# Patient Record
Sex: Female | Born: 1965 | Race: Black or African American | Hispanic: No | Marital: Married | Smoking: Never smoker
Health system: Southern US, Community
[De-identification: ages and names within clinical notes are randomized; demographics above are authoritative.]

## PROBLEM LIST (undated history)

## (undated) DIAGNOSIS — F329 Major depressive disorder, single episode, unspecified: Secondary | ICD-10-CM

## (undated) DIAGNOSIS — F4310 Post-traumatic stress disorder, unspecified: Secondary | ICD-10-CM

## (undated) DIAGNOSIS — I10 Essential (primary) hypertension: Secondary | ICD-10-CM

## (undated) DIAGNOSIS — F32A Depression, unspecified: Secondary | ICD-10-CM

## (undated) DIAGNOSIS — F319 Bipolar disorder, unspecified: Secondary | ICD-10-CM

## (undated) DIAGNOSIS — C801 Malignant (primary) neoplasm, unspecified: Secondary | ICD-10-CM

---

## 1997-12-02 ENCOUNTER — Inpatient Hospital Stay (HOSPITAL_COMMUNITY): Admission: AD | Admit: 1997-12-02 | Discharge: 1997-12-02 | Payer: Self-pay | Admitting: Obstetrics and Gynecology

## 1998-02-20 ENCOUNTER — Inpatient Hospital Stay (HOSPITAL_COMMUNITY): Admission: AD | Admit: 1998-02-20 | Discharge: 1998-02-20 | Payer: Self-pay | Admitting: Obstetrics and Gynecology

## 1998-02-21 ENCOUNTER — Inpatient Hospital Stay (HOSPITAL_COMMUNITY): Admission: AD | Admit: 1998-02-21 | Discharge: 1998-02-23 | Payer: Self-pay | Admitting: Obstetrics and Gynecology

## 1999-05-27 ENCOUNTER — Inpatient Hospital Stay (HOSPITAL_COMMUNITY): Admission: AD | Admit: 1999-05-27 | Discharge: 1999-05-29 | Payer: Self-pay | Admitting: *Deleted

## 2000-05-22 ENCOUNTER — Observation Stay (HOSPITAL_COMMUNITY): Admission: AD | Admit: 2000-05-22 | Discharge: 2000-05-22 | Payer: Self-pay | Admitting: Obstetrics and Gynecology

## 2000-05-22 ENCOUNTER — Encounter: Payer: Self-pay | Admitting: Obstetrics and Gynecology

## 2000-05-22 ENCOUNTER — Ambulatory Visit (HOSPITAL_COMMUNITY): Admission: RE | Admit: 2000-05-22 | Discharge: 2000-05-22 | Payer: Self-pay | Admitting: Obstetrics and Gynecology

## 2000-09-03 ENCOUNTER — Inpatient Hospital Stay (HOSPITAL_COMMUNITY): Admission: AD | Admit: 2000-09-03 | Discharge: 2000-09-03 | Payer: Self-pay | Admitting: Obstetrics and Gynecology

## 2000-09-09 ENCOUNTER — Encounter (INDEPENDENT_AMBULATORY_CARE_PROVIDER_SITE_OTHER): Payer: Self-pay | Admitting: Specialist

## 2000-09-09 ENCOUNTER — Inpatient Hospital Stay (HOSPITAL_COMMUNITY): Admission: AD | Admit: 2000-09-09 | Discharge: 2000-09-11 | Payer: Self-pay | Admitting: Obstetrics and Gynecology

## 2001-08-03 ENCOUNTER — Encounter: Admission: RE | Admit: 2001-08-03 | Discharge: 2001-08-03 | Payer: Self-pay | Admitting: Family Medicine

## 2001-08-03 ENCOUNTER — Encounter: Payer: Self-pay | Admitting: Family Medicine

## 2002-01-25 ENCOUNTER — Emergency Department (HOSPITAL_COMMUNITY): Admission: EM | Admit: 2002-01-25 | Discharge: 2002-01-25 | Payer: Self-pay | Admitting: Emergency Medicine

## 2002-01-25 ENCOUNTER — Encounter: Payer: Self-pay | Admitting: Emergency Medicine

## 2003-06-12 ENCOUNTER — Other Ambulatory Visit: Admission: RE | Admit: 2003-06-12 | Discharge: 2003-06-12 | Payer: Self-pay | Admitting: Obstetrics and Gynecology

## 2007-08-16 ENCOUNTER — Other Ambulatory Visit: Admission: RE | Admit: 2007-08-16 | Discharge: 2007-08-16 | Payer: Self-pay | Admitting: Family Medicine

## 2008-11-05 ENCOUNTER — Other Ambulatory Visit: Admission: RE | Admit: 2008-11-05 | Discharge: 2008-11-05 | Payer: Self-pay | Admitting: Family Medicine

## 2010-06-11 NOTE — Discharge Summary (Signed)
[facility]  Patient:    Bethany Flynn, Bethany Flynn Visit Number: [id number] MRN: [id number]          Service Type: OBS Location: [id number] Attending Physician:  Shaune Spittle Dictated by:   Vance Gather Duplantis, C.N.M. Adm. Date:  [id number] Disc. Date: 09/11/00                             Discharge Summary  ADMISSION DIAGNOSES:          1. Intrauterine pregnancy at term.                               2. Oligohydramnios.                               3. Active labor.                               4. Multiparity.                               5. Desires bilateral tubal ligation for                                  sterilization.  DISCHARGE DIAGNOSES:          1. Intrauterine pregnancy at term.                               2. Oligohydramnios.                               3. Active labor.                               4. Multiparity.                               5. Desires bilateral tubal ligation for                                  sterilization.                               6. Breast-feeding.  PROCEDURES THIS ADMISSION:    1. Normal spontaneous vaginal delivery of                                  a viable female infant named Duwayne Heck who weighed                                  6 pounds 6 ounces and had Apgars of 9 and 9  on September 09, 2000, attended by                                  Dr. Dierdre Forth.                               2. Postpartum bilateral tubal ligation for                                  sterilization on September 10, 2000, also by                                  Dr. Dierdre Forth.  HOSPITAL COURSE:              Bethany Flynn is a 45 year old, married black female, gravida 3, para 2-0-0-2, at 39-4/7 weeks who presents complaining of regular uterine contractions and in active labor on September 09, 2000. She progressed from 4 cm to delivery within about 45 minutes and delivered a viable female  infant named Duwayne Heck who weighed 6 pounds 6 ounces and had Apgars of 9 and 9 on September 09, 2000, attended by Dr. Dierdre Forth. Please see delivery note for details.  She desired bilateral tubal ligation for sterilization and underwent the same on September 10, 2000, also attended by Dr. Dierdre Forth without complication. Please see operative note for details.  Postpartally and postoperatively, she has done well. She is ambulating, voiding, and eating without difficulty. She is breast-feeding, also without difficulty. Her vital signs are stable and she is afebrile. She is deemed ready to discharge today.  DISCHARGE INSTRUCTIONS:       Instructions are as per the [facility] handout.  DISCHARGE MEDICATIONS:        1. Motrin 600 mg p.o. q.6h. p.r.n. for pain.                               2. Tylox one to two p.o. q.4-6h. p.r.n. for                                  pain.                               3. Prenatal vitamins daily.  DISCHARGE LABORATORIES:       Hemoglobin is 11.6, WBC count is 12.0, and platelets are 279,000.  DISCHARGE FOLLOWUP:           The patient is to follow up in six weeks at [facility] or p.r.n.  DD:  09/11/00 TD:  09/11/00 Job: [id number]

## 2010-06-11 NOTE — H&P (Signed)
[facility]  Patient:    Bethany Flynn, Bethany Flynn                       MRN: [id number] Adm. Date:  [id number] Attending:  Pleas Koch Dictator:   Mack Guise, C.N.M.                         History and Physical  HISTORY OF PRESENT ILLNESS:   Ms. Starkes is a 45 year old, gravida 2, para 1-0-0-1, at 39 weeks.  EDD Jun 02, 1999, by dates confirmed with pregnancy ultrasonography, who presents in early labor with contractions increasing in frequency and intensity. She reports positive fetal movement, no bleeding, no rupture of membranes.  Denies any headache, visual changes, or epigastric pain.  Her pregnancy has been followed by the M.D. service at [facility] and Gynecology and is remarkable for:  1) History of positive Group B Strep.  2) Second pregnancy in less than 12 months.  3) History of cryosurgery.  HISTORY OF PRESENT PREGNANCY: This patient was initially evaluated at the office of [facility] and Gynecology on October 06, 1998, at approximately [redacted] weeks gestation.  Her pregnancy has been essentially unremarkable and has been followed by the M.D. service.  Her uterus has been compatible in size with her dates.  Systolic blood pressure has ranged from 80 to 110, diastolic blood pressure has ranged from 60 to 80.  There has been no proteinuria.  PAST OBSTETRIC HISTORY:       In January of 2000, the patient had a normal spontaneous vaginal delivery with a birth of a 6 pound 4 ounce female infant with no complications.  PAST MEDICAL HISTORY:         Abnormal Pap smear, colpo, and cryosurgery. History of positive Group B Strep with last pregnancy.  FAMILY HISTORY:               Mother with congestive heart failure.  Maternal grandfather enlarged heart, chronic hypertension in the patients mother and father. History of maternal grandmother with lung cancer.  HABITS:                       The patient  denies the use of tobacco, alcohol, or illicit drugs.  MEDICATIONS:                  Prenatal vitamins.  ALLERGIES:                    No known drug allergies.  SOCIAL HISTORY:               Ms. Tuft is a 45 year old African-American married female.  Her husband is Cherre Kothari.  He is involved and supportive.  They are of the [place] faith.  PHYSICAL EXAMINATION:  VITAL SIGNS:                  Stable, afebrile.  HEENT:                        Unremarkable.  HEART:                        Regular rate and rhythm.  LUNGS:                        Clear.  ABDOMEN:  Gravid in contour.  Uterus fundus is noted to extend 39 cm above the level of the pubic symphysis.  Leopold maneuver finds fetus to e in the longitudinal lie cephalic presentation and the estimated fetal weight is 7 pounds.  PELVIC:                       Digital examination of the cervix finds it to be cm dilated, 90% effaced, with cephalic presenting part at -1 station and membranes  intact.  The baseline of the fetal heart rate monitor is 140s with average longterm variability, positive accellerations, no decellerations, and negative CST.  The  patient is contracting mildly and irregularly at the present time, but has made  change since her last cervical check in the office.  ASSESSMENT:                   1. Intrauterine pregnancy at 39 weeks.                               2. In early labor.  PLAN:                         Admit to birthing suite per Cecilio Asper, M.D.  Routine M.D. orders.  Group B Strep prophylaxis. DD:  05/27/99 TD:  05/27/99 Job: [id number]

## 2010-06-11 NOTE — H&P (Signed)
[facility]  Patient:    Bethany Flynn, Bethany Flynn                       MRN: [id number] Adm. Date:  [id number] Attending:  Leonard Schwartz Dictator:   Vance Gather Duplantis, C.N.M.                         History and Physical  CHIEF COMPLAINT:              Ms. Demarcus is a 45 year old married black female, gravida 3, para 2-0-0-2 t [redacted] weeks gestation, who presents complaining of significant shortness of breath that increased over the weekend.  She reports that it started on Saturday and had gradually increased in severity until this morning; was quite intolerable.  She reports that she had difficulty talking and singing to her children because of shortness of breath. She denied any significant chest pain, any coughing or cold symptoms; no fever or no audible wheezes to herself.  She had no trauma to her chest and denies any history of significant allergies or asthma.  OB-GYN HISTORY:               Her pregnancy has been followed at [facility] by the M.D. service, and has been at risk for: 1. Positive Group B strep with a previous pregnancy. 2. History of cryosurgery. 3. Third pregnancy in two years.  She delivered a viable female infant in January 2000, who weighed 6 pounds 4 ounces with no complications.  In May 2001 she delivered another female infant with no complications.  GENERAL MEDICAL HISTORY: 1. Usual childhood diseases. 2. No other significant medical problems, other than some anemia.  ALLERGIES:                    No known drug allergies.  FAMILY HISTORY:               Significant for mother with congestive heart failure, and maternal grandmother with enlarged heart.  Mother and father with chronic hypertension.  Maternal grandmother with lung cancer.  GENETIC HISTORY:              Negative.  SOCIAL HISTORY:               She is married to Tilman Neat, who is involved and supportive.  She is a full-time mom.  She denies  being of the [place] faith.  She denies any illicit drug use, alcohol or smoking with this pregnancy.  PRENATAL LABS:                Blood Type 0 positive.  Antibody screen negative.  Rubella equivocal.  Hepatitis B surface antigen negative.  HIV nonreactive.  GC and chlamydia both negative.  Pap within normal limits.  PHYSICAL EXAMINATION:  VITAL SIGNS:                  Temperature 98.8, pulse 91, respirations 22, blood pressure 128/61, O2 saturations are anywhere between 96 and 99% on room air.  Fetal heart rate 140 to 145, with variability.  No uterine contractions are noted.  CERVIX:                       Deferred at this time.  DIAGNOSTIC TESTING:           The patient had an equivocal VQ scan at [facility].  Her CBC was within normal limits.  ASSESSMENT:                   1. Intrauterine pregnancy at 24 weeks.                               2. Rule out pulmonary embolus.  PLAN:                         The patient, per Dr. Leonard Schwartz, is to be admitted for observation overnight.  Further discussion of plans will be made per himself. DD:  05/22/00 TD:  05/22/00 Job: [id number]

## 2010-06-11 NOTE — Op Note (Signed)
Bethany Flynn    [facility] [place]                                  MRN:  [id number]           Operative Report                                  ATT:  Erie Noe P. Pennie Rushing, M.D.  Procedure:  09/10/00             DICT: PREOPERATIVE DIAGNOSIS:       Desire for surgical sterilization.  POSTOPERATIVE DIAGNOSIS:      Desire for surgical sterilization.  OPERATION:                    Postpartum tubal sterilization.  SURGEON:                      Vanessa P. Pennie Rushing, M.D.  ANESTHESIA:                   Spinal and local.  ESTIMATED BLOOD LOSS:         Less than 20 cc.  COMPLICATIONS:                None.  FINDINGS:                     The tubes were normal for the postpartum state.  DESCRIPTION OF PROCEDURE:     A discussion had been held with the patient and her husband concerning the indications for her procedure which include her desire for permanent sterilization. She understands the availability of reversible contraception as well as permanent sterilization for her husband. I have discussed the risks of anesthesia, bleeding, infection, damage to adjacent organs, and the small risk of failure of the tubal resulting in subsequent pregnancies, some of which are ectopic. The patient and her husband voiced understanding and wished to proceed.  The patient was taken to the operating room after appropriate identification and placed on the operating table. A spinal anesthetic was placed and she was placed in the supine position. The abdomen was prepped with multiple layers of Betadine and draped as a sterile field. The subumbilical area was infiltrated with a total of 20 cc of 0.25% Marcaine. After assurance of adequate anesthesia, a transverse incision was made subumbilically and a combination of blunt and sharp dissection allowed entry into the peritoneal cavity. The left fallopian tube was then identified, followed to its fimbriated end,  then grasped at the isthmic portion and elevated. A suture of 2-0 chromic was placed through the mesosalpinx and tied fore and aft on the knuckle of tube. A second suture was placed proximal to that and the intervening knuckle of tube excised. A larger adjacent segment of tube, which included more of the isthmus, was then suture ligated with 2-0 chromic and tied proximally to allow excision of a larger portion of tube. The cut ends were cauterized and noted to be hemostatic. On the right side, the fallopian tube was followed to its fimbriated end then grasped at the isthmic portion and a suture of 2-0 chromic placed through the mesosalpinx then tied fore and aft on the knuckle of the tube. A second ligature was placed proximal to that and the intervening knuckle of  tube excised with the cut ends cauterized. Hemostasis was noted to be adequate. The abdominal peritoneum was closed in a pursestring fashion with 0 Vicryl. Abdominal fascia was closed in a running fashion with 0 Vicryl. A subcuticular suture of 3-0 Vicryl was used to close the skin incision. A sterile dressing was applied. The patient was taken from the operating room to the recovery room in satisfactory condition having tolerated the procedure well with sponge and instrument counts correct. DD:  09/10/00 TD:  09/11/00 Job: [id number]

## 2010-07-27 ENCOUNTER — Encounter: Payer: Self-pay | Admitting: Family Medicine

## 2010-07-27 ENCOUNTER — Inpatient Hospital Stay (INDEPENDENT_AMBULATORY_CARE_PROVIDER_SITE_OTHER)
Admission: RE | Admit: 2010-07-27 | Discharge: 2010-07-27 | Disposition: A | Discharge status: Home / Self Care | Payer: Managed Care, Other (non HMO) | Source: Ambulatory Visit | Attending: Family Medicine | Admitting: Family Medicine

## 2010-07-27 DIAGNOSIS — R040 Epistaxis: Secondary | ICD-10-CM

## 2010-07-27 DIAGNOSIS — I10 Essential (primary) hypertension: Secondary | ICD-10-CM

## 2010-07-27 DIAGNOSIS — J309 Allergic rhinitis, unspecified: Secondary | ICD-10-CM | POA: Insufficient documentation

## 2010-07-27 DIAGNOSIS — E039 Hypothyroidism, unspecified: Secondary | ICD-10-CM | POA: Insufficient documentation

## 2010-12-27 NOTE — Progress Notes (Signed)
Summary: NOSE BLEEDS   Vital Signs:  Patient Profile:   45 Years Old Female CC:      epistaxis O2 Sat:      99 % O2 treatment:    Room Air Temp:     98.4 degrees F oral Pulse rate:   102 / minute Resp:     16 per minute BP sitting:   153 / 115  (right arm) Cuff size:   regular  Pt. in pain?   no  Vitals Entered By: Lajean Saver RN (July 27, 2010 12:05 PM)                   Updated Prior Medication List: NASACORT AQ 55 MCG/ACT AERS (TRIAMCINOLONE ACETONIDE(NASAL)) 2 sprays in each nostril once daily AMLODIPINE BESYLATE 5 MG TABS (AMLODIPINE BESYLATE)  HYDROCHLOROTHIAZIDE 25 MG TABS (HYDROCHLOROTHIAZIDE)  SYNTHROID 100 MCG TABS (LEVOTHYROXINE SODIUM)  WELLBUTRIN XL 300 MG XR24H-TAB (BUPROPION HCL)  LAMICTAL 150 MG TABS (LAMOTRIGINE)   Current Allergies: No known allergies History of Present Illness Chief Complaint: epistaxis History of Present Illness:  Subjective:  Patient complains of a right side anterior nosebleed that stopped spontaneously yesterday.  She developed another nosebleed this morning and proceeded to our clinic.   She admits that she has frequent nasal congestion and post-nasal drainage but does not take any kind of allergy medication.  She reports that her BP is usually controlled with her present meds:  amlodipine 5mg  daily and HCTZ 25mg  daily. She denies any taking aspirin or blood thinners.  She feels well otherwise.    Serial Vital Signs/Assessments:  Time      Position  BP       Pulse  Resp  Temp     By 12:50 PM            134/89                         Lajean Saver RN REVIEW OF SYSTEMS Constitutional Symptoms      Denies fever, chills, night sweats, weight loss, weight gain, and fatigue.  Eyes       Denies change in vision, eye pain, eye discharge, glasses, contact lenses, and eye surgery. Ear/Nose/Throat/Mouth       Complains of frequent nose bleeds.      Denies hearing loss/aids, change in hearing, ear pain, ear discharge, dizziness,  frequent runny nose, sinus problems, sore throat, hoarseness, and tooth pain or bleeding.  Respiratory       Denies dry cough, productive cough, wheezing, shortness of breath, asthma, bronchitis, and emphysema/COPD.  Cardiovascular       Denies murmurs, chest pain, and tires easily with exhertion.    Gastrointestinal       Denies stomach pain, nausea/vomiting, diarrhea, constipation, blood in bowel movements, and indigestion. Genitourniary       Denies painful urination, blood or discharge from vagina, kidney stones, and loss of urinary control. Neurological       Denies paralysis, seizures, and fainting/blackouts. Musculoskeletal       Denies muscle pain, joint pain, joint stiffness, decreased range of motion, redness, swelling, muscle weakness, and gout.  Skin       Denies bruising, unusual mles/lumps or sores, and hair/skin or nail changes.  Psych       Denies mood changes, temper/anger issues, anxiety/stress, speech problems, depression, and sleep problems. Other Comments: Patietn c/o second nose bleed in two days. She is still actively bleeding.  Past History:  Past Medical History: Hypertension Hypothyroidism Bipolar disorder  Social History: Married Never Smoked Alcohol use-no Drug use-no Smoking Status:  never Drug Use:  no   Objective:  Repeat BP sitting:  134/89 Appearance:  Patient appears healthy, stated age, and in no acute distress.  She is dabbing her right nares with a slow nosebleed Eyes:  Pupils are equal, round, and reactive to light and accomodation.  Extraocular movement is intact.  Conjunctivae are not inflamed.  Nose:  Both nares reveal congested turbinates.  Right nares reveal mild epistaxis but no obvious bleeding points.  No sinus tenderness  Pharynx:  Normal  Neck:  Supple.  No adenopathy is present.   Lungs:  Clear to auscultation.  Breath sounds are equal.  Heart:  Regular rate and rhythm without murmurs, rubs, or gallops.  Abdomen:   Nontender Extremities:  No edema.   Skin:  No rash Assessment New Problems: HYPOTHYROIDISM (ICD-244.9) ALLERGIC RHINITIS CAUSE UNSPECIFIED (ICD-477.9) EPISTAXIS, RECURRENT (ICD-784.7) HYPERTENSION, BENIGN ESSENTIAL, UNCONTROLLED (ICD-401.1)   Plan New Medications/Changes: NASACORT AQ 55 MCG/ACT AERS (TRIAMCINOLONE ACETONIDE(NASAL)) 2 sprays in each nostril once daily  #1 x 1, 07/27/2010, Donna Christen MD  New Orders: New Patient Level V [id number] Planning Comments:   Patient instructed to blow her nose, then instilled 3 sprays Afrin (oxymetazoline) into right nares.  Instructed patient in properly squeezing nostrils firmly for about 15 minues.  Afterwards, right nares reveals turbinates to be less swollen and epistaxis resolved. Instructed in proper treatment of epistaxis if it recurs. Advised to continue Afrin for about 5 days, then disontinue.  Begin Nasacort AQ nasal spray, applying the spray about 15 minutes after using Afrin. Advised to check BP daily and record on calendar Follow-up with PCP as scheduled. If develops posterior nosebleed, proceed to ER   The patient and/or caregiver has been counseled thoroughly with regard to medications prescribed including dosage, schedule, interactions, rationale for use, and possible side effects and they verbalize understanding.  Diagnoses and expected course of recovery discussed and will return if not improved as expected or if the condition worsens. Patient and/or caregiver verbalized understanding.  Prescriptions: NASACORT AQ 55 MCG/ACT AERS (TRIAMCINOLONE ACETONIDE(NASAL)) 2 sprays in each nostril once daily  #1 x 1   Entered and Authorized by:   Donna Christen MD   Signed by:   Donna Christen MD on 07/27/2010   Method used:   Print then Give to Patient   RxID:   [id number]   Patient Instructions: 1)  Use Afrin nasal spray (or generic oxymetazoline) twice daily for about 5 days.  About 15 minutes after using the Afrin spray, use  your once daily dose of Nasacort nasal spray.   Also recommend using saline nasal spray several times daily and/or saline nasal irrigation in about a week after nosebleeds have stopped. 2)  Recommend check and record BP daily. 3)  Follow-up with Family Doctor as scheduled  Orders Added: 1)  New Patient Level V [99205]  Appended Document: NOSE BLEEDS Rx written for home BP monitor

## 2013-03-06 ENCOUNTER — Ambulatory Visit (HOSPITAL_COMMUNITY): Payer: Self-pay | Admitting: Psychiatry

## 2013-04-11 ENCOUNTER — Encounter (HOSPITAL_COMMUNITY): Payer: Self-pay

## 2013-04-16 ENCOUNTER — Ambulatory Visit (HOSPITAL_COMMUNITY): Payer: Self-pay | Admitting: Psychiatry

## 2013-04-18 ENCOUNTER — Ambulatory Visit (HOSPITAL_COMMUNITY): Payer: Self-pay | Admitting: Psychiatry

## 2016-12-31 ENCOUNTER — Encounter (HOSPITAL_COMMUNITY): Payer: Self-pay | Admitting: *Deleted

## 2016-12-31 ENCOUNTER — Other Ambulatory Visit: Payer: Self-pay

## 2016-12-31 DIAGNOSIS — M549 Dorsalgia, unspecified: Secondary | ICD-10-CM | POA: Diagnosis not present

## 2016-12-31 DIAGNOSIS — Z5321 Procedure and treatment not carried out due to patient leaving prior to being seen by health care provider: Secondary | ICD-10-CM | POA: Diagnosis not present

## 2016-12-31 NOTE — ED Triage Notes (Addendum)
Pt reports intermittent right sided back pain and soreness. Pt also reports nasal drainage.

## 2017-01-01 ENCOUNTER — Emergency Department (HOSPITAL_COMMUNITY)
Admission: EM | Admit: 2017-01-01 | Discharge: 2017-01-01 | Disposition: A | Discharge status: Home / Self Care | Payer: Managed Care, Other (non HMO) | Attending: Emergency Medicine | Admitting: Emergency Medicine

## 2017-01-01 ENCOUNTER — Emergency Department (HOSPITAL_COMMUNITY): Payer: Managed Care, Other (non HMO)

## 2017-01-01 HISTORY — DX: Essential (primary) hypertension: I10

## 2017-01-01 HISTORY — DX: Post-traumatic stress disorder, unspecified: F43.10

## 2017-01-01 HISTORY — DX: Depression, unspecified: F32.A

## 2017-01-01 HISTORY — DX: Major depressive disorder, single episode, unspecified: F32.9

## 2017-01-01 HISTORY — DX: Bipolar disorder, unspecified: F31.9

## 2017-01-01 HISTORY — DX: Malignant (primary) neoplasm, unspecified: C80.1

## 2017-01-01 NOTE — ED Notes (Signed)
Upon entering room pt is not in the room and the BP cuff is lying on the bed. Pt did not notify staff that she was leaving.

## 2019-06-27 IMAGING — DX DG LUMBAR SPINE COMPLETE 4+V
5 series · 5 of 5 positions shown · non-contrast
Comparison: None.

CLINICAL DATA: Intermittent RIGHT back pain and soreness.

EXAM:
LUMBAR SPINE - COMPLETE 4+ VIEW

[l-spine ap]
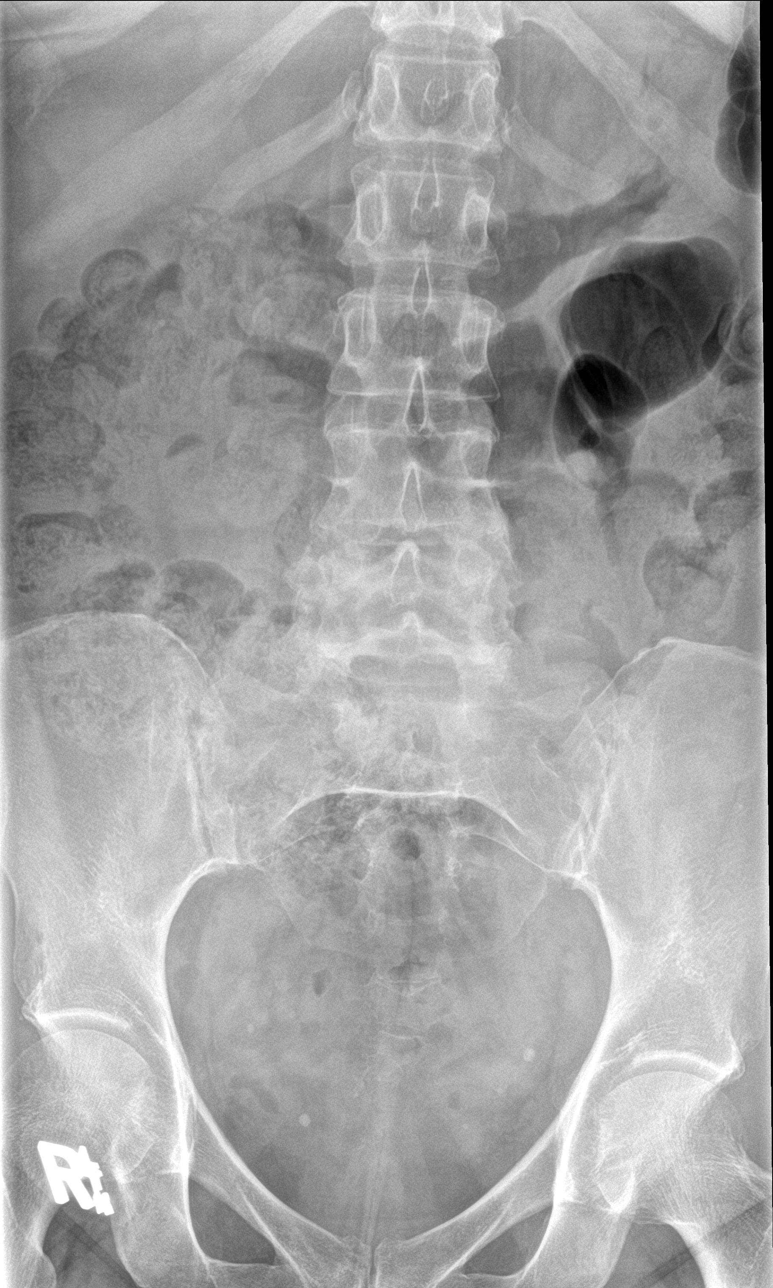

[l-spine obl (1 of 2)]
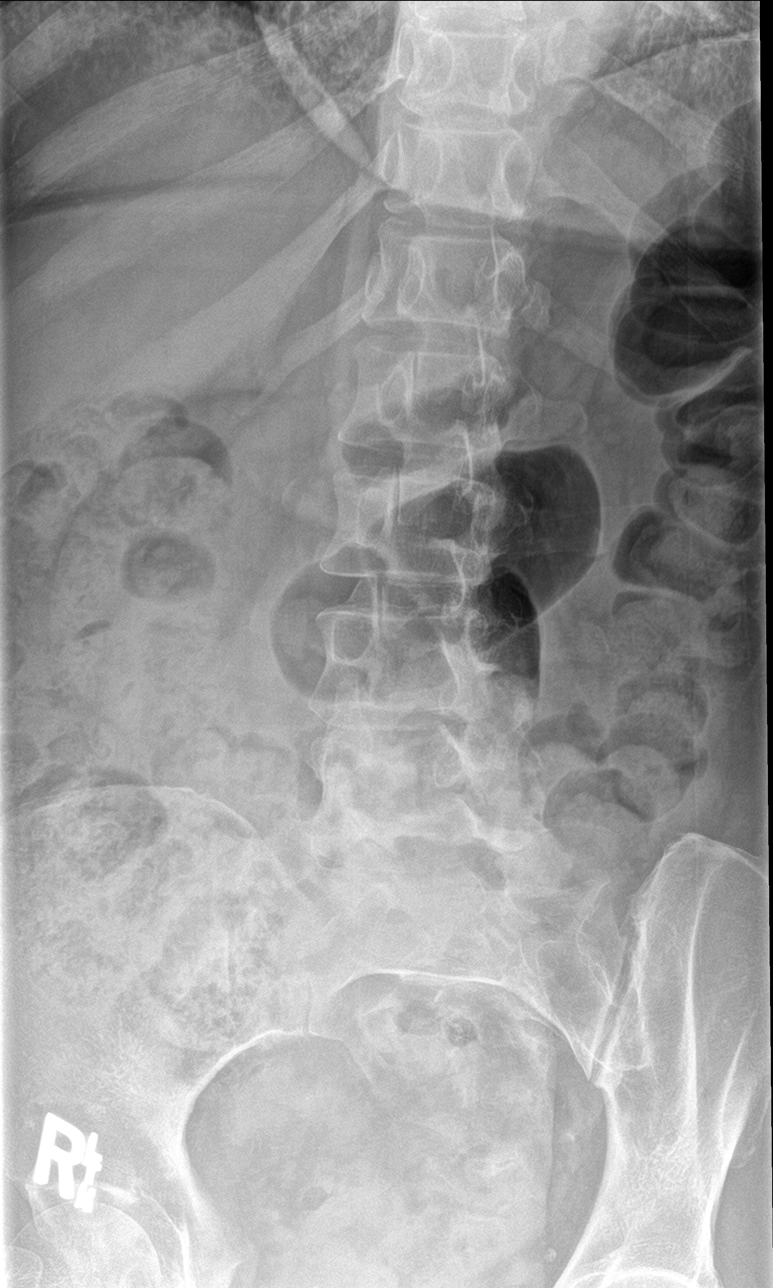

[l-spine obl (2 of 2)]
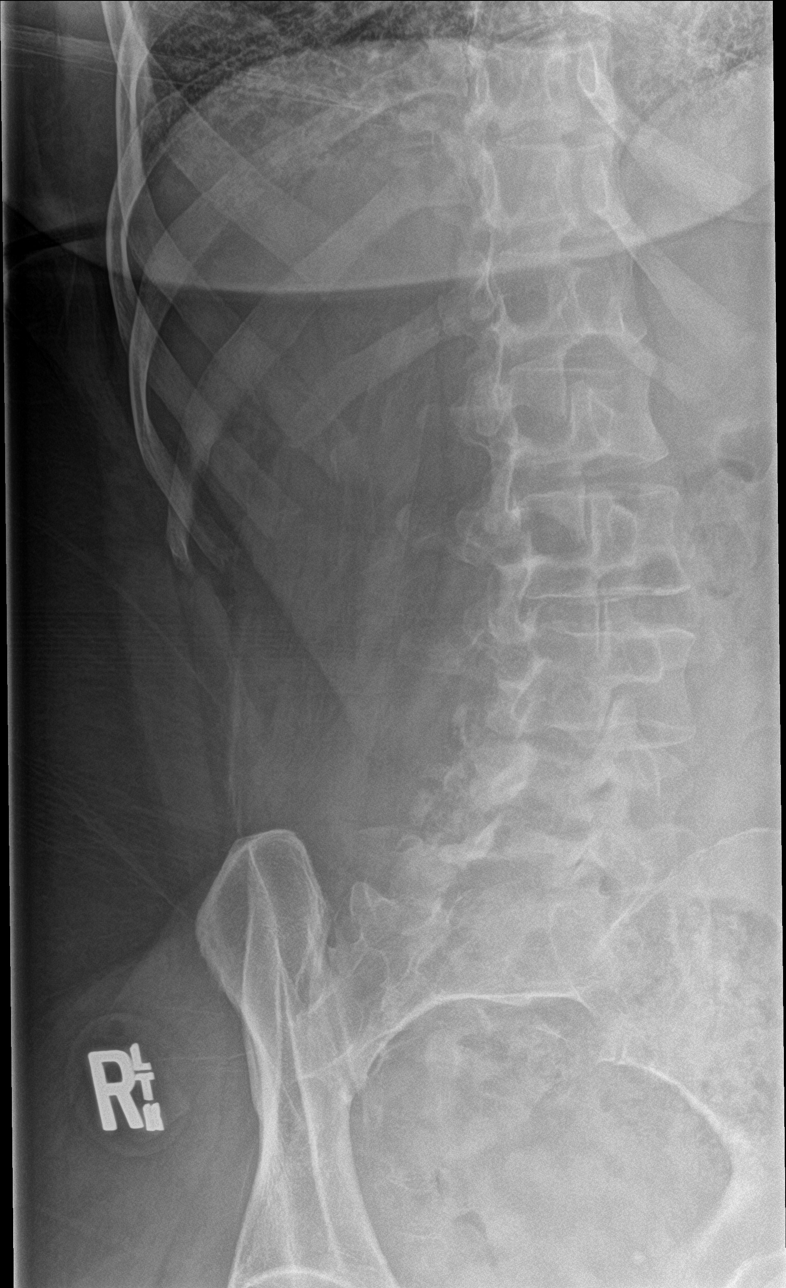

[l-spine lat]
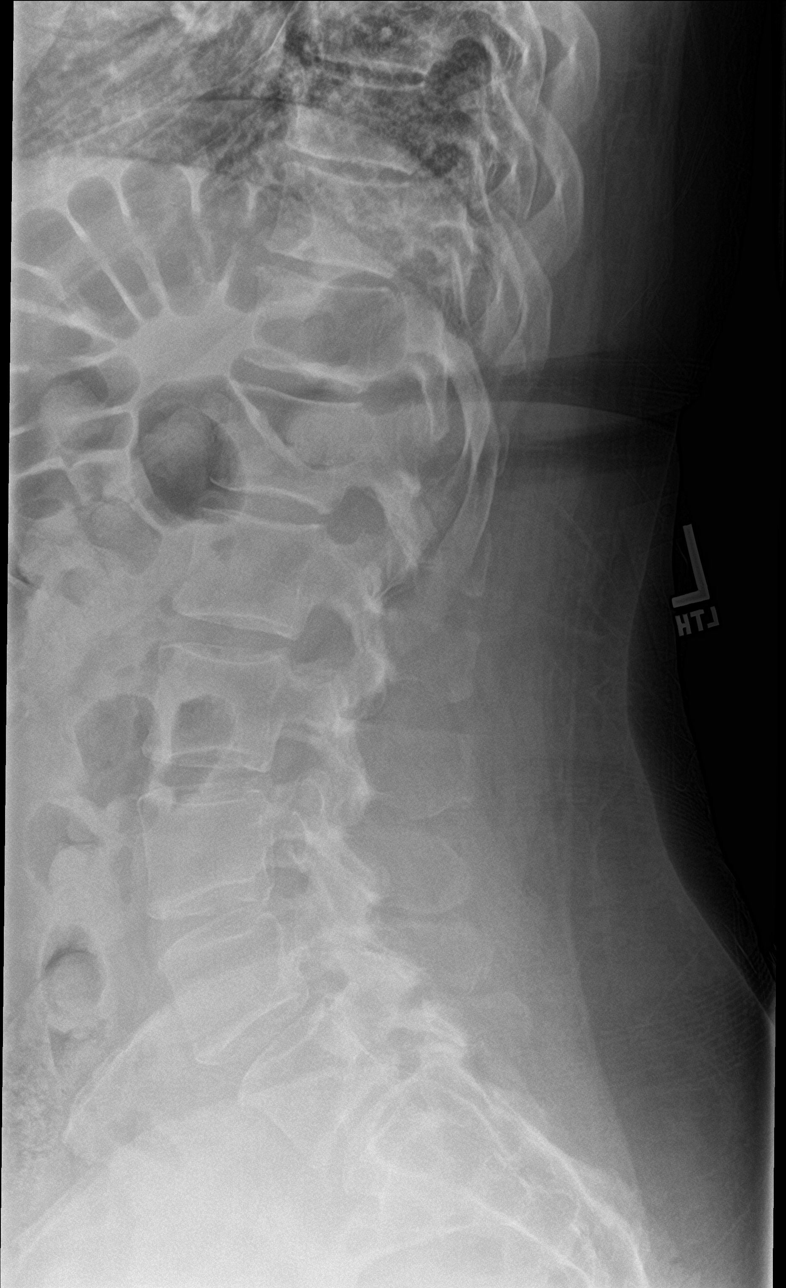

[l-spine spot]
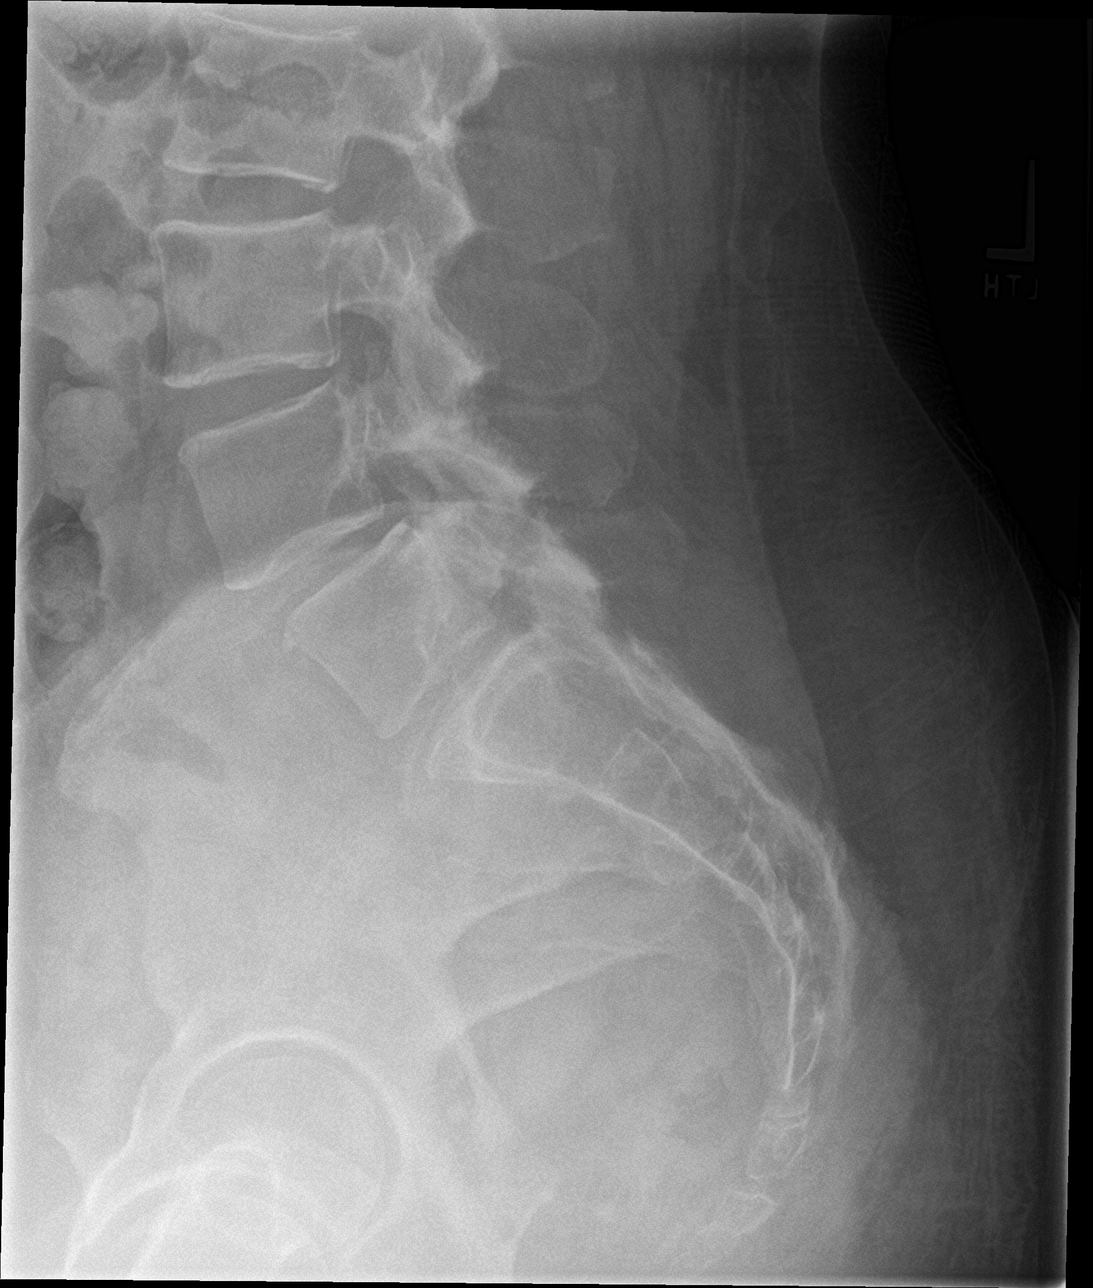

[5 of 5 positions shown; findings below may reference images not displayed]

FINDINGS: For non rib-bearing lumbar-type vertebral bodies are intact and
aligned with maintenance of the lumbar lordosis. Partially
sacralized L5 vertebral body. Intervertebral disc heights are
normal. No destructive bony lesions. Mild lower lumbar facet
arthropathy.

Mild lower joints are symmetric. Included prevertebral and
paraspinal soft tissue planes are non-suspicious. Phleboliths
project in the pelvis.
IMPRESSION: Transitional anatomy. Mild lower lumbar facet arthropathy. No acute
fracture deformity or malalignment.

## 2021-05-13 DIAGNOSIS — Z930 Tracheostomy status: Secondary | ICD-10-CM | POA: Diagnosis not present

## 2021-05-13 DIAGNOSIS — J8417 Interstitial lung disease with progressive fibrotic phenotype in diseases classified elsewhere: Secondary | ICD-10-CM | POA: Diagnosis not present

## 2021-05-13 DIAGNOSIS — G894 Chronic pain syndrome: Secondary | ICD-10-CM

## 2021-05-13 DIAGNOSIS — G7281 Critical illness myopathy: Secondary | ICD-10-CM | POA: Diagnosis not present

## 2021-05-13 DIAGNOSIS — J9621 Acute and chronic respiratory failure with hypoxia: Secondary | ICD-10-CM | POA: Diagnosis not present

## 2021-05-15 DIAGNOSIS — J8417 Interstitial lung disease with progressive fibrotic phenotype in diseases classified elsewhere: Secondary | ICD-10-CM

## 2021-05-15 DIAGNOSIS — Z930 Tracheostomy status: Secondary | ICD-10-CM

## 2021-05-15 DIAGNOSIS — G894 Chronic pain syndrome: Secondary | ICD-10-CM

## 2021-05-15 DIAGNOSIS — G7281 Critical illness myopathy: Secondary | ICD-10-CM

## 2021-05-15 DIAGNOSIS — J9621 Acute and chronic respiratory failure with hypoxia: Secondary | ICD-10-CM

## 2021-05-16 DIAGNOSIS — G7281 Critical illness myopathy: Secondary | ICD-10-CM | POA: Diagnosis not present

## 2021-05-16 DIAGNOSIS — J8417 Interstitial lung disease with progressive fibrotic phenotype in diseases classified elsewhere: Secondary | ICD-10-CM | POA: Diagnosis not present

## 2021-05-16 DIAGNOSIS — Z930 Tracheostomy status: Secondary | ICD-10-CM | POA: Diagnosis not present

## 2021-05-16 DIAGNOSIS — J9621 Acute and chronic respiratory failure with hypoxia: Secondary | ICD-10-CM | POA: Diagnosis not present

## 2021-05-16 DIAGNOSIS — G894 Chronic pain syndrome: Secondary | ICD-10-CM

## 2021-05-17 DIAGNOSIS — Z930 Tracheostomy status: Secondary | ICD-10-CM | POA: Diagnosis not present

## 2021-05-17 DIAGNOSIS — G7281 Critical illness myopathy: Secondary | ICD-10-CM | POA: Diagnosis not present

## 2021-05-17 DIAGNOSIS — J9621 Acute and chronic respiratory failure with hypoxia: Secondary | ICD-10-CM | POA: Diagnosis not present

## 2021-05-17 DIAGNOSIS — G894 Chronic pain syndrome: Secondary | ICD-10-CM

## 2021-05-17 DIAGNOSIS — J8417 Interstitial lung disease with progressive fibrotic phenotype in diseases classified elsewhere: Secondary | ICD-10-CM | POA: Diagnosis not present

## 2021-05-18 DIAGNOSIS — J9621 Acute and chronic respiratory failure with hypoxia: Secondary | ICD-10-CM | POA: Diagnosis not present

## 2021-05-18 DIAGNOSIS — G7281 Critical illness myopathy: Secondary | ICD-10-CM | POA: Diagnosis not present

## 2021-05-18 DIAGNOSIS — J8417 Interstitial lung disease with progressive fibrotic phenotype in diseases classified elsewhere: Secondary | ICD-10-CM | POA: Diagnosis not present

## 2021-05-18 DIAGNOSIS — Z930 Tracheostomy status: Secondary | ICD-10-CM | POA: Diagnosis not present

## 2021-05-18 DIAGNOSIS — G894 Chronic pain syndrome: Secondary | ICD-10-CM

## 2021-05-19 DIAGNOSIS — J9621 Acute and chronic respiratory failure with hypoxia: Secondary | ICD-10-CM | POA: Diagnosis not present

## 2021-05-19 DIAGNOSIS — G894 Chronic pain syndrome: Secondary | ICD-10-CM

## 2021-05-19 DIAGNOSIS — Z930 Tracheostomy status: Secondary | ICD-10-CM | POA: Diagnosis not present

## 2021-05-19 DIAGNOSIS — G7281 Critical illness myopathy: Secondary | ICD-10-CM | POA: Diagnosis not present

## 2021-05-19 DIAGNOSIS — J8417 Interstitial lung disease with progressive fibrotic phenotype in diseases classified elsewhere: Secondary | ICD-10-CM | POA: Diagnosis not present

## 2021-06-24 DEATH — deceased
